# Patient Record
Sex: Male | Born: 1945 | Race: Black or African American | Hispanic: No | Marital: Married | Smoking: Never smoker
Health system: Southern US, Community
[De-identification: ages and names within clinical notes are randomized; demographics above are authoritative.]

## PROBLEM LIST (undated history)

## (undated) DIAGNOSIS — L0291 Cutaneous abscess, unspecified: Secondary | ICD-10-CM

## (undated) DIAGNOSIS — K5792 Diverticulitis of intestine, part unspecified, without perforation or abscess without bleeding: Secondary | ICD-10-CM

## (undated) DIAGNOSIS — J40 Bronchitis, not specified as acute or chronic: Secondary | ICD-10-CM

## (undated) DIAGNOSIS — M25569 Pain in unspecified knee: Secondary | ICD-10-CM

## (undated) HISTORY — PX: ABSCESS DRAINAGE: SHX1119

---

## 2007-04-23 ENCOUNTER — Emergency Department (HOSPITAL_COMMUNITY): Admission: EM | Admit: 2007-04-23 | Discharge: 2007-04-23 | Payer: Self-pay | Admitting: Emergency Medicine

## 2007-08-25 ENCOUNTER — Inpatient Hospital Stay (HOSPITAL_COMMUNITY): Admission: EM | Admit: 2007-08-25 | Discharge: 2007-08-28 | Payer: Self-pay | Admitting: General Surgery

## 2010-06-20 NOTE — Op Note (Signed)
NAME:  [name], Clarence Gonzalez               ACCOUNT NO.:  [id number]   MEDICAL RECORD NO.:  [id number]          PATIENT TYPE:  INP   LOCATION:  1344                         FACILITY:  [facility]   PHYSICIAN:  Clarence Gonzalez, M.D.DATE OF BIRTH:  1945-06-15   DATE OF PROCEDURE:  08/25/2007  DATE OF DISCHARGE:                               OPERATIVE REPORT   PREOPERATIVE DIAGNOSIS:  Complex perirectal abscess in the perineal body  area.   POSTOPERATIVE DIAGNOSIS:  Complex perirectal abscess extending posterior  to base of penis.   ANESTHESIA:  General anesthesia.   PROCEDURE:  Drainage of complex perirectal abscess.   HISTORY:  Clarence Gonzalez is a 65 year old male who presented to our  office today.  Clarence Gonzalez, M.D. has been treating him for an  infection.  The patient states that earlier in the Spring he had a  perirectal abscess in the left buttock area that was drained in the  office.  He improved and appeared to be doing fine.  I do not have the  details of  Dr. Meridee Gonzalez office notes, but he presented to the [facility] office today in [place] and had a large abscess on the opposite  side.  It was at the base of the scrotum, kind of midline and thought  that this was certainly too large to try any type of drainage in the  office.  He was sent on over to the hospital and started on intravenous  antibiotics of Zosyn and I saw him and was certainly in agreement.  The  abscess this time was on the right side with a depth big perirectal  cheek type abscess.  I cannot feel any actual internal fistula opening  either anterior or posterior and then at the base of the scrotum in the  midline he has a big lemon-size defect that I am sure communicates with  the perirectal component.  I do not appreciate __________ the left side  that you can see the little incision at approximately 3 o'clock area.  The little area was drained by Angelia Gonzalez. Clarence Gonzalez, M.D.  The patient  stated that years ago  I drained the perirectal abscess on him, but I do  not have details of this.   DESCRIPTION OF PROCEDURE:  The patient was taken to the operative suite  with informed consent.  His white count was not elevated and he was  induced under general anesthesia by __________ and placed in the [facility]  stirrups.  With the examination under anesthesia the findings were the  same; marked cellulitis and swelling on the right side and up top in the  left and the area going to the base of the scrotum sort of right at the  midline area.  I prepped him with Betadine solution and then used a  needle to aspirate into the largest component up to approximately  midline on the right side and probably a tablespoon of pus was aspirated  and this was sent for aerobic and anaerobic cultures.  I then made a  little incision in the area and the communication goes  to the posterior  midline and then anterior all along the right side and this loculation  was broken up.  I then made a little incision right over the biggest  swollen area at the base of the penis right at the midline and this was  frank purulence and all of the tissue I culture it for aerobic and  anaerobic and all of the necrotic tissue was sharply debrided.  Hemostasis did not appear to be a problem and I then could definitely  identify the communication a little deeper than right immediately under  the skin going to the base of the scrotum.  We then opened that up and  used irrigation with saline to break up the little pockets of frank  abscess.  I then put two quarter inch Penrose drains to communicate  across the area up to the base of the scrotum and tied the two ends  together so they would not fall out and then the wound was packed with  half inch Iodoform gauze with solution of Betadine applied to it and  then ABD and stretch panties were placed on the patient.  Hopefully he  will be able to void, but if there is any question we will put a Foley   catheter and we will await the results of the cultures.  On careful  anoscopic examination with a Hemostat, I cannot find obvious  communication in the anterior or posterior.  I do not know where the  abscesses are where I would expect the communication, but suspect that  this is something that has been brewing for several days, possibly a  week or so.  The patient states that the reason Dr. Georgina Gonzalez had him on  antibiotics and Flomax __________ from urinary __________ but the  patient says that he cannot tell much difference with the Flomax  __________.  The patient was awakened and taken to the recovery room in  stable postoperative condition.  We will start Sitz baths in the  morning.  I expect he is going to be in the hospital at least a couple  of days or so due to this complex perirectal abscess.           ______________________________  Clarence Pancoast. Clarence Gonzalez, M.D.     WJW/MEDQ  D:  08/25/2007  T:  08/26/2007  Job:  [id number]

## 2010-06-20 NOTE — H&P (Signed)
NAME:  Clarence Gonzalez, Clarence Gonzalez               ACCOUNT NO.:  [id number]   MEDICAL RECORD NO.:  [id number]          PATIENT TYPE:  EMS   LOCATION:  MAJO                         FACILITY:  [facility]   PHYSICIAN:  Juanetta Gosling, MDDATE OF BIRTH:  Apr 20, 1945   DATE OF ADMISSION:  04/23/2007  DATE OF DISCHARGE:  04/23/2007                              HISTORY & PHYSICAL   Mr. Clarence Gonzalez is a 65 year old male who has a prior history in March of  having a left gluteal abscess drained and packed by Dr. Derrell Lolling at [facility].  He returns today with a several-day history of a right gluteal abscess  that has now, over the last day or so, extended towards his scrotum and  involves his perineum.  He denies any fevers at home.  This area has  become significantly more tender in the last 24 hours.   PAST MEDICAL HISTORY:  Includes the gluteal abscess drainage, several  months ago a right groin abscess incised and drained.  History of  diverticulitis.  Hypertension and BPH.   CURRENT MEDICATIONS:  He is taking Flomax.  He does not know what  antihypertensive medicine that he takes.   He has no known drug allergies.   SOCIAL HISTORY:  He does not smoke or drink.   REVIEW OF SYSTEMS:  As documented in the [facility] chart.   EXAMINATION:  RECTAL:  A right gluteal abscess with induration and  tenderness in that area both anteriorly and posteriorly extending down  into his perineum towards the scrotum.  I did not complete a rectal exam  due to his tenderness.   ASSESSMENT:  Right gluteal abscess.   PLAN:  For admission to [facility].  IV antibiotics due to  some erythema around this area p.o.  Incision and drainage of this  abscess in the operating room.  He and I discussed this plan and he will  proceed to [facility].      Juanetta Gosling, MD  Electronically Signed     MCW/MEDQ  D:  08/25/2007  T:  08/25/2007  Job:  [id number]

## 2010-06-23 NOTE — Discharge Summary (Signed)
NAME:  Clarence Gonzalez, Clarence Gonzalez               ACCOUNT NO.:  [id number]   MEDICAL RECORD NO.:  [id number]          PATIENT TYPE:  INP   LOCATION:  1344                         [facility]ACILITY:  [facility]   PHYSICIAN:  Anselm Pancoast. Weatherly, M.D.DATE OF BIRTH:  Aug 31, 1945   DATE OF ADMISSION:  08/25/2007  DATE OF DISCHARGE:  08/28/2007                               DISCHARGE SUMMARY   HISTORY AND HOSPITAL COURSE:  Clarence Gonzalez is a 65 year old moderately  overweight male who was seen in the urgent office by Dr. Dwain Sarna on  August 25, 2007 with the following history.  He had a perirectal abscess  drained by Dr. Derrell Lolling in the office approximately three months earlier.  He stated he had swelling and pain and significant difficulty passing  his urine.  When he presented back he had recurrent perirectal abscess.  The area was swollen over the right side perirectal and also swelling  right in the perineal body in the midline.  Dr. Dwain Sarna felt that this  was much too large to drain as a procedure in the urgent office.  I was  called and asked that the patient be sent over and he did.  His white  count was 8,300, his hematocrit was 30.8. He was taken to the operating  room and under general anesthesia, I placed him on Zosyn for the  perirectal abscess and he was taken to the operating room and placed in  [place] stirrups.  At first a very careful rectal examination with  endoscope done and I could not find any internal opening.  I opened up  the large abscess to the right of the perirectal area, cultured it and  then noted that there was definitely a crack that extends up to the base  of the perineal area and opened that area.  Both of these areas required  marked debridement because of the infected fissure but it appears to  kind of track out laterally.  Postoperatively he said he felt much  better.  The results of the cultures showed  gram-positive organism and  it was three days before they identified that this  was MRSA.  I was  surprised in that this was not an obvious perirectal abscess that is  usually not MRSA and I do not think the abscess had been cultured  previously in the office.  We switched him to Septra DS.  At the time of  his discharge he still had the little Penrose drains connected to two  areas that communicated and most likely this is not a true perirectal  abscess but complex abscess of the buttocks that is MRSA.  His white  count postoperatively was also 7,200 and the wound is cleaning up  nicely.  He started soaking in Sitz baths the day after surgery and was  discharged on antibiotics.  He had a Foley catheter for approximately  two days since he had a problem with BPH but he was able to void  satisfactorily.  I will see him in the office in followup.  He will  continue to soak the area and hopefully this will  not be a recurrent  problem.  He is to continue with all of his usual medications with the  addition of the Septra DS.           ______________________________  Anselm Pancoast. Zachery Dakins, M.D.     WJW/MEDQ  D:  09/22/2007  T:  09/22/2007  Job:  [id number]   cc:   Oley Balm. Georgina Pillion, M.D.  Fax: [phone]

## 2010-10-30 LAB — URINALYSIS, ROUTINE W REFLEX MICROSCOPIC
Glucose, UA: 100 — AB
Ketones, ur: 15 — AB
pH: 6

## 2010-10-30 LAB — URINE MICROSCOPIC-ADD ON

## 2010-10-30 LAB — URINE CULTURE

## 2010-11-03 LAB — CBC
MCHC: 32.7
Platelets: 274
Platelets: 288
RBC: 3.24 — ABNORMAL LOW
RBC: 3.68 — ABNORMAL LOW
WBC: 7.2

## 2010-11-03 LAB — ANAEROBIC CULTURE

## 2010-11-03 LAB — CULTURE, ROUTINE-ABSCESS: Gram Stain: NONE SEEN

## 2010-11-03 LAB — BASIC METABOLIC PANEL
BUN: 20
CO2: 30
Calcium: 9.3
Creatinine, Ser: 1.38
GFR calc Af Amer: >60

## 2014-09-22 ENCOUNTER — Encounter (HOSPITAL_COMMUNITY): Payer: Self-pay

## 2014-09-22 ENCOUNTER — Emergency Department (HOSPITAL_COMMUNITY)
Admission: EM | Admit: 2014-09-22 | Discharge: 2014-09-22 | Disposition: A | Discharge status: Home / Self Care | Payer: BLUE CROSS/BLUE SHIELD | Attending: Emergency Medicine | Admitting: Emergency Medicine

## 2014-09-22 ENCOUNTER — Emergency Department (HOSPITAL_COMMUNITY): Payer: BLUE CROSS/BLUE SHIELD

## 2014-09-22 DIAGNOSIS — K5732 Diverticulitis of large intestine without perforation or abscess without bleeding: Secondary | ICD-10-CM | POA: Diagnosis not present

## 2014-09-22 DIAGNOSIS — Z79899 Other long term (current) drug therapy: Secondary | ICD-10-CM | POA: Diagnosis not present

## 2014-09-22 DIAGNOSIS — Z872 Personal history of diseases of the skin and subcutaneous tissue: Secondary | ICD-10-CM | POA: Insufficient documentation

## 2014-09-22 DIAGNOSIS — Z8709 Personal history of other diseases of the respiratory system: Secondary | ICD-10-CM | POA: Insufficient documentation

## 2014-09-22 DIAGNOSIS — R1032 Left lower quadrant pain: Secondary | ICD-10-CM | POA: Diagnosis present

## 2014-09-22 HISTORY — DX: Bronchitis, not specified as acute or chronic: J40

## 2014-09-22 HISTORY — DX: Cutaneous abscess, unspecified: L02.91

## 2014-09-22 HISTORY — DX: Diverticulitis of intestine, part unspecified, without perforation or abscess without bleeding: K57.92

## 2014-09-22 HISTORY — DX: Pain in unspecified knee: M25.569

## 2014-09-22 LAB — COMPREHENSIVE METABOLIC PANEL
ALBUMIN: 4.3 g/dL (ref 3.5–5.0)
ALK PHOS: 73 U/L (ref 38–126)
ALT: 19 U/L (ref 17–63)
AST: 19 U/L (ref 15–41)
Anion gap: 7 (ref 5–15)
BILIRUBIN TOTAL: 1 mg/dL (ref 0.3–1.2)
BUN: 18 mg/dL (ref 6–20)
CALCIUM: 9.2 mg/dL (ref 8.9–10.3)
CO2: 25 mmol/L (ref 22–32)
CREATININE: 1.15 mg/dL (ref 0.61–1.24)
Chloride: 106 mmol/L (ref 101–111)
GFR calc non Af Amer: >60 mL/min (ref >60)
GLUCOSE: 99 mg/dL (ref 65–99)
Potassium: 3.9 mmol/L (ref 3.5–5.1)
SODIUM: 138 mmol/L (ref 135–145)
Total Protein: 7.4 g/dL (ref 6.5–8.1)

## 2014-09-22 LAB — LIPASE, BLOOD: Lipase: 24 U/L (ref 22–51)

## 2014-09-22 LAB — CBC WITH DIFFERENTIAL/PLATELET
Basophils Absolute: 0 10*3/uL (ref 0.0–0.1)
Basophils Relative: 0 % (ref 0–1)
EOS ABS: 0.1 10*3/uL (ref 0.0–0.7)
EOS PCT: 2 % (ref 0–5)
HCT: 35.7 % — ABNORMAL LOW (ref 39.0–52.0)
Hemoglobin: 11.5 g/dL — ABNORMAL LOW (ref 13.0–17.0)
LYMPHS ABS: 1 10*3/uL (ref 0.7–4.0)
Lymphocytes Relative: 11 % — ABNORMAL LOW (ref 12–46)
MCH: 27.4 pg (ref 26.0–34.0)
MCHC: 32.2 g/dL (ref 30.0–36.0)
MCV: 85.2 fL (ref 78.0–100.0)
MONOS PCT: 8 % (ref 3–12)
Monocytes Absolute: 0.7 10*3/uL (ref 0.1–1.0)
Neutro Abs: 7.1 10*3/uL (ref 1.7–7.7)
Neutrophils Relative %: 79 % — ABNORMAL HIGH (ref 43–77)
PLATELETS: 219 10*3/uL (ref 150–400)
RBC: 4.19 MIL/uL — ABNORMAL LOW (ref 4.22–5.81)
RDW: 12.7 % (ref 11.5–15.5)
WBC: 8.9 10*3/uL (ref 4.0–10.5)

## 2014-09-22 LAB — URINALYSIS, ROUTINE W REFLEX MICROSCOPIC
Bilirubin Urine: NEGATIVE
GLUCOSE, UA: NEGATIVE mg/dL
HGB URINE DIPSTICK: NEGATIVE
KETONES UR: NEGATIVE mg/dL
Leukocytes, UA: NEGATIVE
Nitrite: NEGATIVE
PROTEIN: NEGATIVE mg/dL
Specific Gravity, Urine: 1.023 (ref 1.005–1.030)
Urobilinogen, UA: 0.2 mg/dL (ref 0.0–1.0)
pH: 6 (ref 5.0–8.0)

## 2014-09-22 MED ORDER — METRONIDAZOLE 500 MG PO TABS: 500.0000 mg | ORAL_TABLET | Freq: Two times a day (BID) | ORAL | Status: AC

## 2014-09-22 MED ORDER — SODIUM CHLORIDE 0.9 % IV BOLUS (SEPSIS)
1000.0000 mL | Freq: Once | INTRAVENOUS | Status: AC
  Administered 2014-09-22: 1000 mL via INTRAVENOUS

## 2014-09-22 MED ORDER — MORPHINE SULFATE (PF) 4 MG/ML IV SOLN
4.0000 mg | Freq: Once | INTRAVENOUS | Status: AC
Start: 2014-09-22 — End: 2014-09-22
  Administered 2014-09-22: 4 mg via INTRAVENOUS
  Filled 2014-09-22: qty 1

## 2014-09-22 MED ORDER — DOCUSATE SODIUM 100 MG PO CAPS: 100.0000 mg | ORAL_CAPSULE | Freq: Two times a day (BID) | ORAL | Status: AC

## 2014-09-22 MED ORDER — IOHEXOL 300 MG/ML  SOLN
50.0000 mL | Freq: Once | INTRAMUSCULAR | Status: AC | PRN
  Administered 2014-09-22: 50 mL via ORAL

## 2014-09-22 MED ORDER — IOHEXOL 300 MG/ML  SOLN
100.0000 mL | Freq: Once | INTRAMUSCULAR | Status: AC | PRN
  Administered 2014-09-22: 100 mL via INTRAVENOUS

## 2014-09-22 MED ORDER — CIPROFLOXACIN HCL 500 MG PO TABS: 500.0000 mg | ORAL_TABLET | Freq: Two times a day (BID) | ORAL | Status: AC

## 2014-09-22 MED ORDER — HYDROCODONE-ACETAMINOPHEN 5-325 MG PO TABS: 1.0000 | ORAL_TABLET | ORAL | Status: AC | PRN

## 2014-09-22 NOTE — ED Provider Notes (Signed)
CSN: [id number]     Arrival date & time 09/22/14  0840 History   First MD Initiated Contact with Patient 09/22/14 0914     Chief Complaint  Patient presents with  . Abdominal Pain  . Constipation     (Consider location/radiation/quality/duration/timing/severity/associated sxs/prior Treatment) HPI Patient states he has left lower quadrant pain that feels like a prior episode of diverticulitis. It started fairly acutely. He awakened with the pain this morning. He describes it as severe and aching in his left lower quadrant. It's worse with pressure. He denies having seen blood in his stool. He does report problems with constipation. He denies vomiting. Denies urinary symptoms of pain burning or urgency. Denies testicular pain or flank pain. Past Medical History  Diagnosis Date  . Diverticulitis   . Bronchitis   . Knee pain   . Abscess    Past Surgical History  Procedure Laterality Date  . Abscess drainage     No family history on file. Social History  Substance Use Topics  . Smoking status: Never Smoker   . Smokeless tobacco: None  . Alcohol Use: Yes     Comment: former    Review of Systems 10 Systems reviewed and are negative for acute change except as noted in the HPI.    Allergies  Echinacea-zn  Home Medications   Prior to Admission medications   Medication Sig Start Date End Date Taking? Authorizing Provider  atorvastatin (LIPITOR) 20 MG tablet Take 1 tablet by mouth daily. 09/09/14  Yes Historical Provider, MD  doxazosin (CARDURA) 2 MG tablet Take 1 tablet by mouth daily. 09/09/14  Yes Historical Provider, MD  naproxen sodium (ANAPROX) 220 MG tablet Take 220 mg by mouth 2 (two) times daily as needed (pain).   Yes Historical Provider, MD  ciprofloxacin (CIPRO) 500 MG tablet Take 1 tablet (500 mg total) by mouth 2 (two) times daily. 09/22/14   Arby Barrette, MD  docusate sodium (COLACE) 100 MG capsule Take 1 capsule (100 mg total) by mouth every 12 (twelve) hours.  09/22/14   Arby Barrette, MD  HYDROcodone-acetaminophen (NORCO/VICODIN) 5-325 MG per tablet Take 1-2 tablets by mouth every 4 (four) hours as needed for moderate pain or severe pain. 09/22/14   Arby Barrette, MD  metroNIDAZOLE (FLAGYL) 500 MG tablet Take 1 tablet (500 mg total) by mouth 2 (two) times daily. One po bid x 7 days 09/22/14   Arby Barrette, MD   BP 137/68 mmHg  Pulse 70  Temp(Src) 98.1 F (36.7 C) (Oral)  Resp 18  Ht 6' (1.829 m)  Wt 240 lb (108.863 kg)  BMI 32.54 kg/m2  SpO2 99% Physical Exam  Constitutional: He is oriented to person, place, and time. He appears well-developed and well-nourished.  HENT:  Head: Normocephalic and atraumatic.  Eyes: EOM are normal. Pupils are equal, round, and reactive to light.  Neck: Neck supple.  Cardiovascular: Normal rate, regular rhythm, normal heart sounds and intact distal pulses.   Pulmonary/Chest: Effort normal and breath sounds normal.  Abdominal: Soft. Bowel sounds are normal. He exhibits no distension. There is tenderness.  Left lower quadrant tenderness palpation with voluntary guarding. Femoral pulses 2+ and symmetric. No inguinal mass or fullness.  Genitourinary:  Bilateral testicles smooth and nontender. No scrotal edema.  Musculoskeletal: Normal range of motion. He exhibits no edema.  Neurological: He is alert and oriented to person, place, and time. He has normal strength. Coordination normal. GCS eye subscore is 4. GCS verbal subscore is 5. GCS motor subscore  is 6.  Skin: Skin is warm, dry and intact.  Psychiatric: He has a normal mood and affect.    ED Course  Procedures (including critical care time) Labs Review Labs Reviewed  CBC WITH DIFFERENTIAL/PLATELET - Abnormal; Notable for the following:    RBC 4.19 (*)    Hemoglobin 11.5 (*)    HCT 35.7 (*)    Neutrophils Relative % 79 (*)    Lymphocytes Relative 11 (*)    All other components within normal limits  COMPREHENSIVE METABOLIC PANEL  LIPASE, BLOOD   URINALYSIS, ROUTINE W REFLEX MICROSCOPIC (NOT AT [facility])    Imaging Review Ct Abdomen Pelvis W Contrast  09/22/2014   CLINICAL DATA:  Sudden onset of left lower quadrant abdominal pain beginning this morning.  EXAM: CT ABDOMEN AND PELVIS WITH CONTRAST  TECHNIQUE: Multidetector CT imaging of the abdomen and pelvis was performed using the standard protocol following bolus administration of intravenous contrast.  CONTRAST:  50mL OMNIPAQUE IOHEXOL 300 MG/ML SOLN, OMNIPAQUE IOHEXOL 300 MG/ML SOLN  COMPARISON:  The one-view abdomen 12/20/2009  FINDINGS: Mild dependent atelectasis is present at the lung bases bilaterally. Lungs are otherwise clear. Heart size is normal. No significant pleural or pericardial effusion is present. Gynecomastia is noted bilaterally.  Stomach, duodenum, and pancreas are within normal limits. The liver demonstrates mild fatty infiltration. A 6 mm well-circumscribed lesion in the right lobe likely represents a a small cyst. The common bile duct and gallbladder are within normal limits for age. The adrenal glands are normal bilaterally. Benign appearing cysts are present in the right kidney. The largest measures 1.8 cm. A 1 point cm cyst is present in the left kidney is well. Nonobstructing stones are present at the lower pole wall of both kidneys. The largest is at the lower pole of the left kidney measuring at 12 mm. There is no obstruction. The ureters are within normal limits.  Diverticular changes are present throughout the sigmoid colon. There is focal inflammation at the junction of the descending and sigmoid colon. No free air or abscess formation is present. The more proximal colon is within normal limits. The appendix is visualized and normal. The small bowel is unremarkable.  Extensive atherosclerotic calcifications are present in the aorta and branch vessels. No significant adenopathy or free fluid is present. The prostate gland is somewhat prominent, extending superiorly  into the base the bladder. Transverse diameter is 5.7 cm. A vacuum disc is present at L4-5 and L5-S1. Grade 1 anterolisthesis is present at L2 and L3-4. Vertebral body heights and alignment are maintained. No focal lytic or blastic lesions are present. Mild degenerative changes are noted at the SI joints bilaterally.  IMPRESSION: 1. Inflammatory changes with diverticulitis at the junction of the descending and sigmoid colon. 2. More proximal colon and appendix are visualized and normal. 3. Nonobstructing nephrolithiasis at the lower poles of both kidneys. 4. Bilateral renal cysts. 5. 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 6. Enlargement of the prostate gland.   Electronically Signed   By: Marin Roberts M.D.   On: 09/22/2014 12:24   I have personally reviewed and evaluated these images and lab results as part of my medical decision-making.   EKG Interpretation None      MDM   Final diagnoses:  Diverticulitis of large intestine without perforation or abscess without bleeding    Patient had distant history of diverticulitis. Findings today are consistent with a recurrence. He has not had fever, vomiting. At this time CT does  not show any abscess or perforation. Patient is a good candidate for outpatient treatment. He is counseled on the signs and symptoms for which return. He is counseled on follow-up with his family physician for reassessment next week as well as the need for follow-up colonoscopy.    Arby Barrette, MD 09/22/14 1332

## 2014-09-22 NOTE — ED Notes (Signed)
Pt states sudden onset with LLQ pain started this am. Denies N/V/D and fever.

## 2014-09-22 NOTE — ED Notes (Signed)
MD at bedside. 

## 2014-09-22 NOTE — ED Notes (Signed)
Bed: RU04 Expected date: 09/22/14 Expected time: 9:08 AM Means of arrival: Ambulance Comments: Ems-nosebleed

## 2014-09-22 NOTE — ED Notes (Signed)
Patient transported to CT 

## 2014-09-22 NOTE — Discharge Instructions (Signed)

## 2014-09-22 NOTE — ED Notes (Signed)
Pt aware not to drive.

## 2016-05-26 IMAGING — CT CT ABD-PELV W/ CM
2 of 5 series · 16 of 46 positions shown, 18 images · IV contrast (OMNIPAQUE 300)
Comparison: The one-view abdomen 12/20/2009

CLINICAL DATA: Sudden onset of left lower quadrant abdominal pain
beginning this morning.

EXAM:
CT ABDOMEN AND PELVIS WITH CONTRAST
TECHNIQUE: Multidetector CT imaging of the abdomen and pelvis was performed
using the standard protocol following bolus administration of
intravenous contrast.
CONTRAST:  50mL OMNIPAQUE IOHEXOL 300 MG/ML SOLN, 100mL OMNIPAQUE
IOHEXOL 300 MG/ML SOLN

[Series 2: abd/pel with · axial · 0.83mm/px · z∈[-468,-38]mm · 13 of 98 slices shown, 15 images]
[im 6/98  soft-tissue]
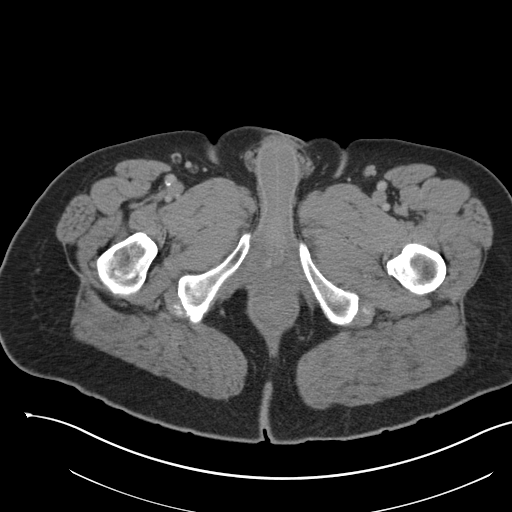
[im 6/98  bone]
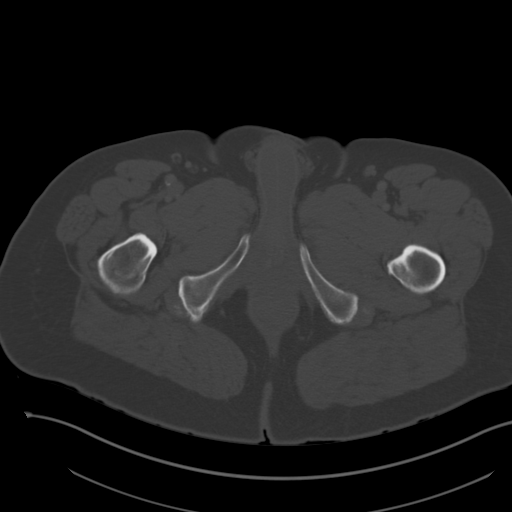
[im 11/98  soft-tissue]
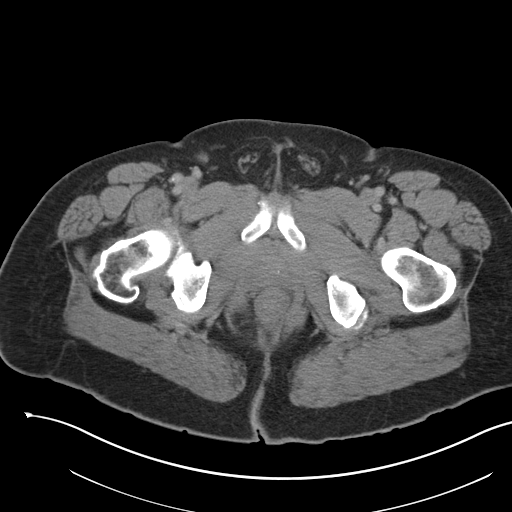
[im 22/98  soft-tissue]
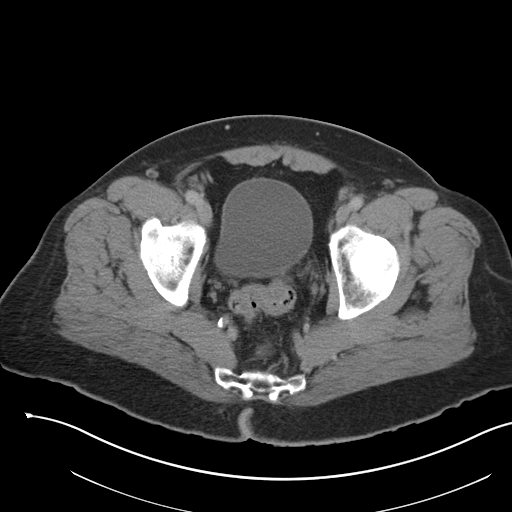
[im 27/98  soft-tissue]
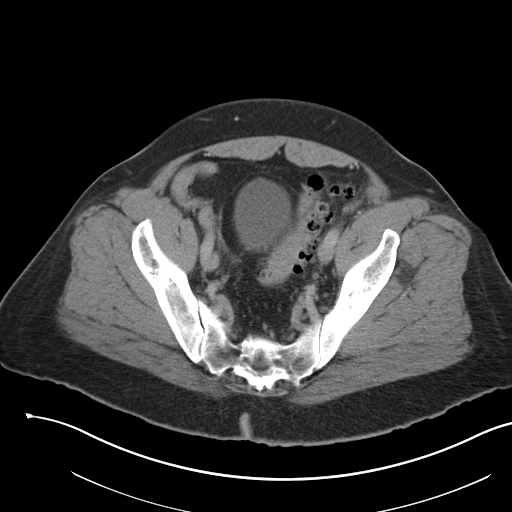
[im 33/98  soft-tissue]
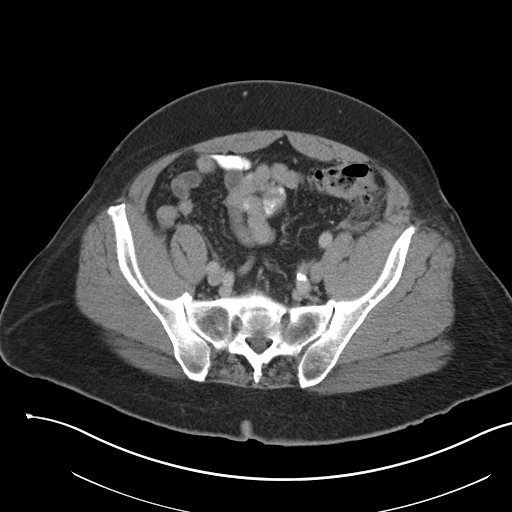
[im 44/98  soft-tissue]
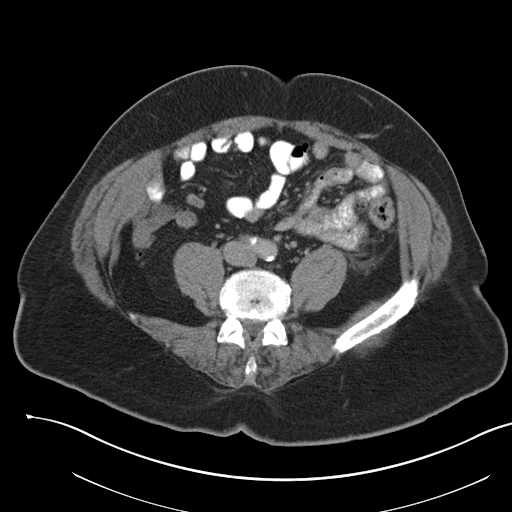
[im 49/98  soft-tissue]
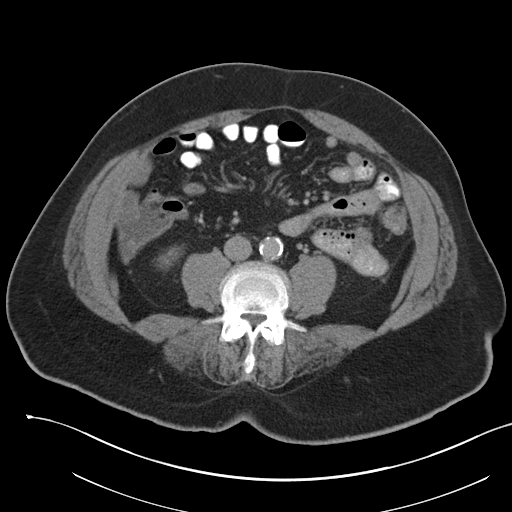
[im 54/98  soft-tissue]
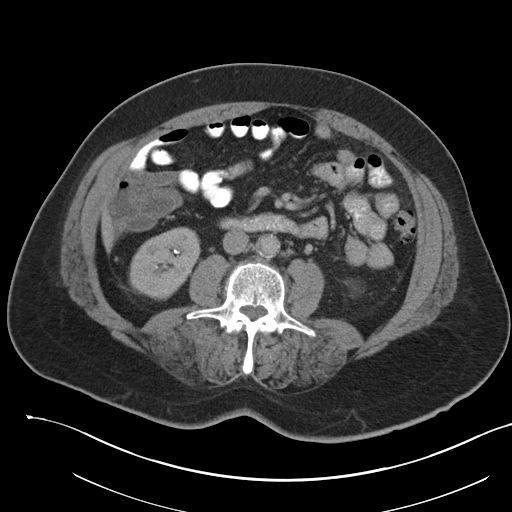
[im 65/98  soft-tissue]
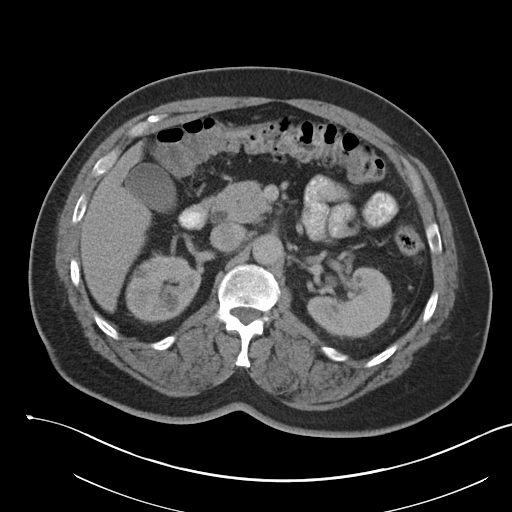
[im 65/98  bone]
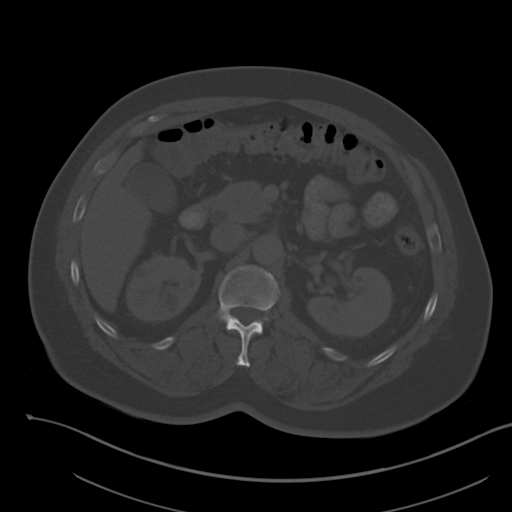
[im 71/98  soft-tissue]
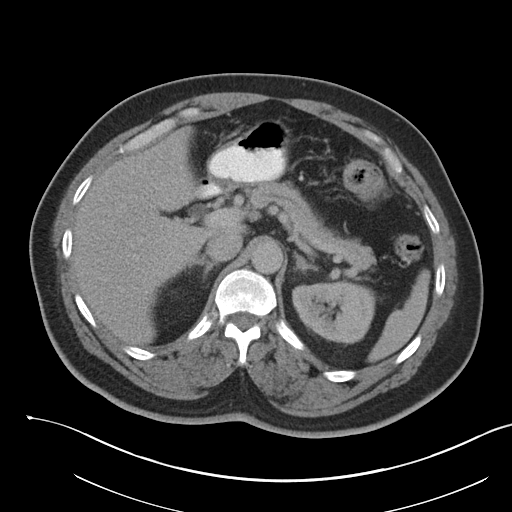
[im 76/98  soft-tissue]
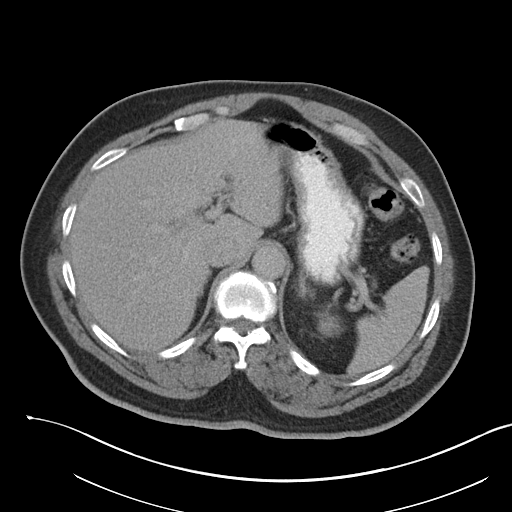
[im 87/98  soft-tissue]
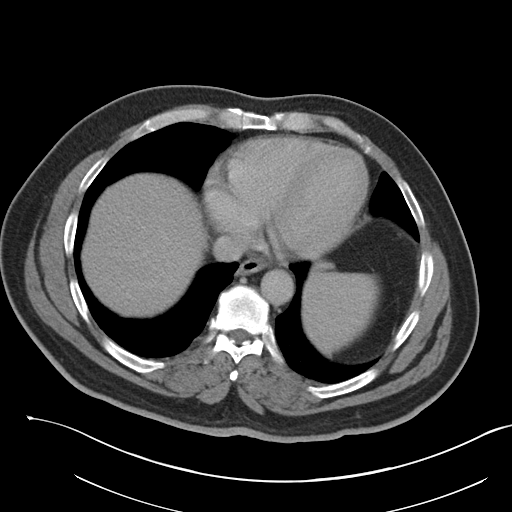
[im 92/98  soft-tissue]
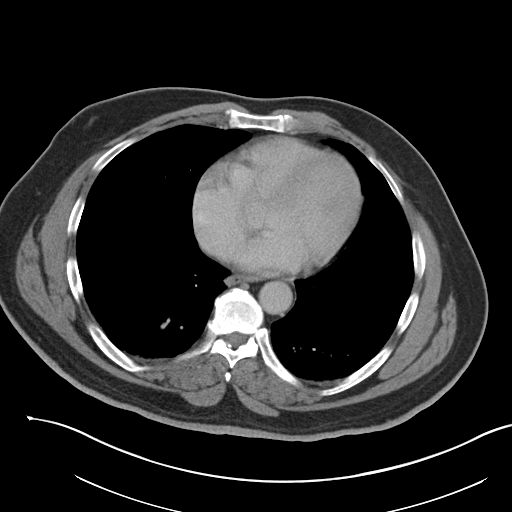

[Series 4: coronal a/|p · coronal · 0.85mm/px · 3 of 127 slices shown]
[im 43/127  soft-tissue]
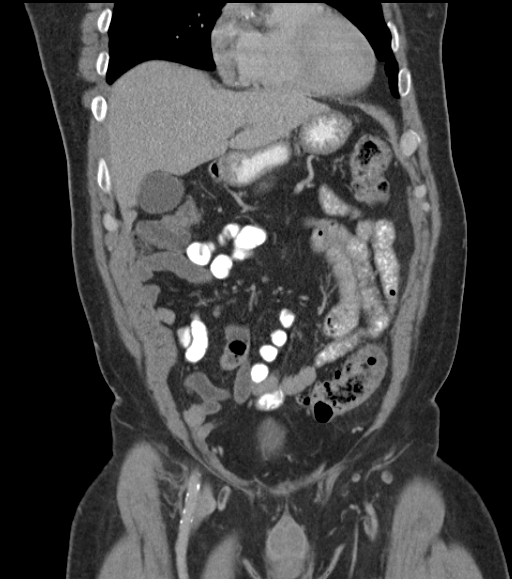
[im 57/127  soft-tissue]
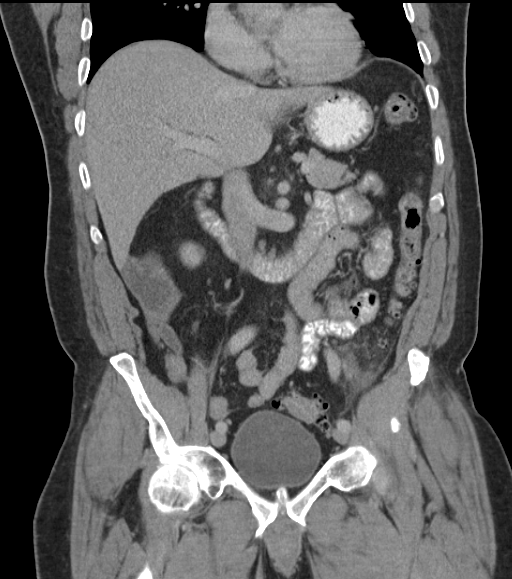
[im 71/127  soft-tissue]
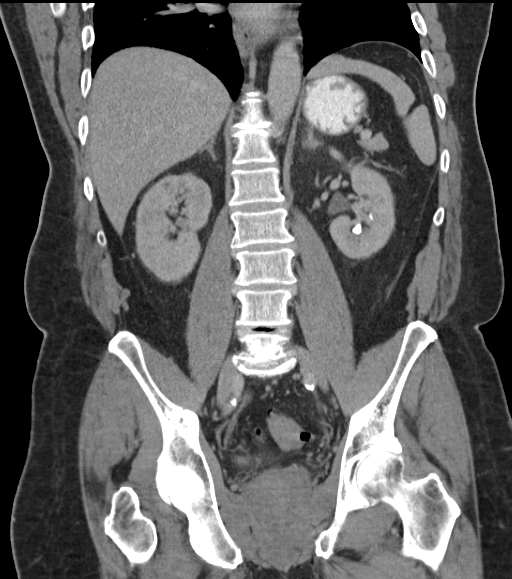

[16 of 46 positions shown; findings below may reference images not displayed]

FINDINGS: Mild dependent atelectasis is present at the lung bases bilaterally.
Lungs are otherwise clear. Heart size is normal. No significant
pleural or pericardial effusion is present. Gynecomastia is noted
bilaterally.

Stomach, duodenum, and pancreas are within normal limits. The liver
demonstrates mild fatty infiltration. A 6 mm well-circumscribed
lesion in the right lobe likely represents a a small cyst. The
common bile duct and gallbladder are within normal limits for age.
The adrenal glands are normal bilaterally. Benign appearing cysts
are present in the right kidney. The largest measures 1.8 cm. A 1
point cm cyst is present in the left kidney is well. Nonobstructing
stones are present at the lower pole wall of both kidneys. The
largest is at the lower pole of the left kidney measuring at 12 mm.
There is no obstruction. The ureters are within normal limits.

Diverticular changes are present throughout the sigmoid colon. There
is focal inflammation at the junction of the descending and sigmoid
colon. No free air or abscess formation is present. The more
proximal colon is within normal limits. The appendix is visualized
and normal. The small bowel is unremarkable.

Extensive atherosclerotic calcifications are present in the aorta
and branch vessels. No significant adenopathy or free fluid is
present. The prostate gland is somewhat prominent, extending
superiorly into the base the bladder. Transverse diameter is 5.7 cm.
A vacuum disc is present at L4-5 and L5-S1. Grade 1 anterolisthesis
is present at L2 and L3-4. Vertebral body heights and alignment are
maintained. No focal lytic or blastic lesions are present. Mild
degenerative changes are noted at the SI joints bilaterally.
IMPRESSION: 1. Inflammatory changes with diverticulitis at the junction of the
descending and sigmoid colon.
2. More proximal colon and appendix are visualized and normal.
3. Nonobstructing nephrolithiasis at the lower poles of both
kidneys.
4. Bilateral renal cysts.
5. Moderate spondylosis in the lower lumbar spine.
6. Enlargement of the prostate gland.

## 2017-04-15 DIAGNOSIS — J0100 Acute maxillary sinusitis, unspecified: Secondary | ICD-10-CM | POA: Diagnosis not present

## 2017-10-22 ENCOUNTER — Ambulatory Visit
Admission: RE | Admit: 2017-10-22 | Discharge: 2017-10-22 | Disposition: A | Discharge status: Home / Self Care | Payer: Self-pay | Source: Ambulatory Visit | Attending: Family Medicine | Admitting: Family Medicine

## 2017-10-22 ENCOUNTER — Other Ambulatory Visit: Payer: Self-pay | Admitting: Family Medicine

## 2017-10-22 DIAGNOSIS — R05 Cough: Secondary | ICD-10-CM | POA: Diagnosis not present

## 2017-10-22 DIAGNOSIS — L918 Other hypertrophic disorders of the skin: Secondary | ICD-10-CM | POA: Diagnosis not present

## 2017-10-22 DIAGNOSIS — Z23 Encounter for immunization: Secondary | ICD-10-CM | POA: Diagnosis not present

## 2017-10-22 DIAGNOSIS — R059 Cough, unspecified: Secondary | ICD-10-CM

## 2017-11-07 DIAGNOSIS — L918 Other hypertrophic disorders of the skin: Secondary | ICD-10-CM | POA: Diagnosis not present

## 2017-11-07 DIAGNOSIS — R05 Cough: Secondary | ICD-10-CM | POA: Diagnosis not present

## 2019-04-03 ENCOUNTER — Ambulatory Visit: Payer: Medicare Other | Attending: Internal Medicine

## 2019-04-03 DIAGNOSIS — Z23 Encounter for immunization: Secondary | ICD-10-CM | POA: Insufficient documentation

## 2019-04-03 NOTE — Progress Notes (Signed)
   Covid-19 Vaccination Clinic  Name:  Jamall [name]    MRN: [id number] DOB: [birth year]  04/03/2019  [name] was observed post Covid-19 immunization for 15 minutes without incidence. He was provided with Vaccine Information Sheet and instruction to access the V-Safe system.   [name] was instructed to call 911 with any severe reactions post vaccine: Marland Kitchen Difficulty breathing  . Swelling of your face and throat  . A fast heartbeat  . A bad rash all over your body  . Dizziness and weakness    Immunizations Administered    Name Date Dose VIS Date Route   Pfizer COVID-19 Vaccine 04/03/2019 11:08 AM 0.3 mL 01/16/2019 Intramuscular   Manufacturer: [employer], [employer]   Lot: [id number]   NDC: [address]-1

## 2019-04-28 ENCOUNTER — Ambulatory Visit: Payer: Medicare Other | Attending: Internal Medicine

## 2019-04-28 DIAGNOSIS — Z23 Encounter for immunization: Secondary | ICD-10-CM

## 2019-04-28 NOTE — Progress Notes (Signed)
   Covid-19 Vaccination Clinic  Name:  Birl [name]    MRN: [id number] DOB: [birth year]  04/28/2019  [name] was observed post Covid-19 immunization for 15 minutes without incident. He was provided with Vaccine Information Sheet and instruction to access the V-Safe system.   [name] was instructed to call 911 with any severe reactions post vaccine: Marland Kitchen Difficulty breathing  . Swelling of face and throat  . A fast heartbeat  . A bad rash all over body  . Dizziness and weakness   Immunizations Administered    Name Date Dose VIS Date Route   Pfizer COVID-19 Vaccine 04/28/2019  1:57 PM 0.3 mL 01/16/2019 Intramuscular   Manufacturer: [employer], [employer]   Lot: [id number]   NDC: [address]-3

## 2019-06-26 IMAGING — CR DG CHEST 2V
2 series · 2 of 2 positions shown · non-contrast
Comparison: None.

CLINICAL DATA: Chronic cough, productive

EXAM:
CHEST - 2 VIEW

[w chest pa]
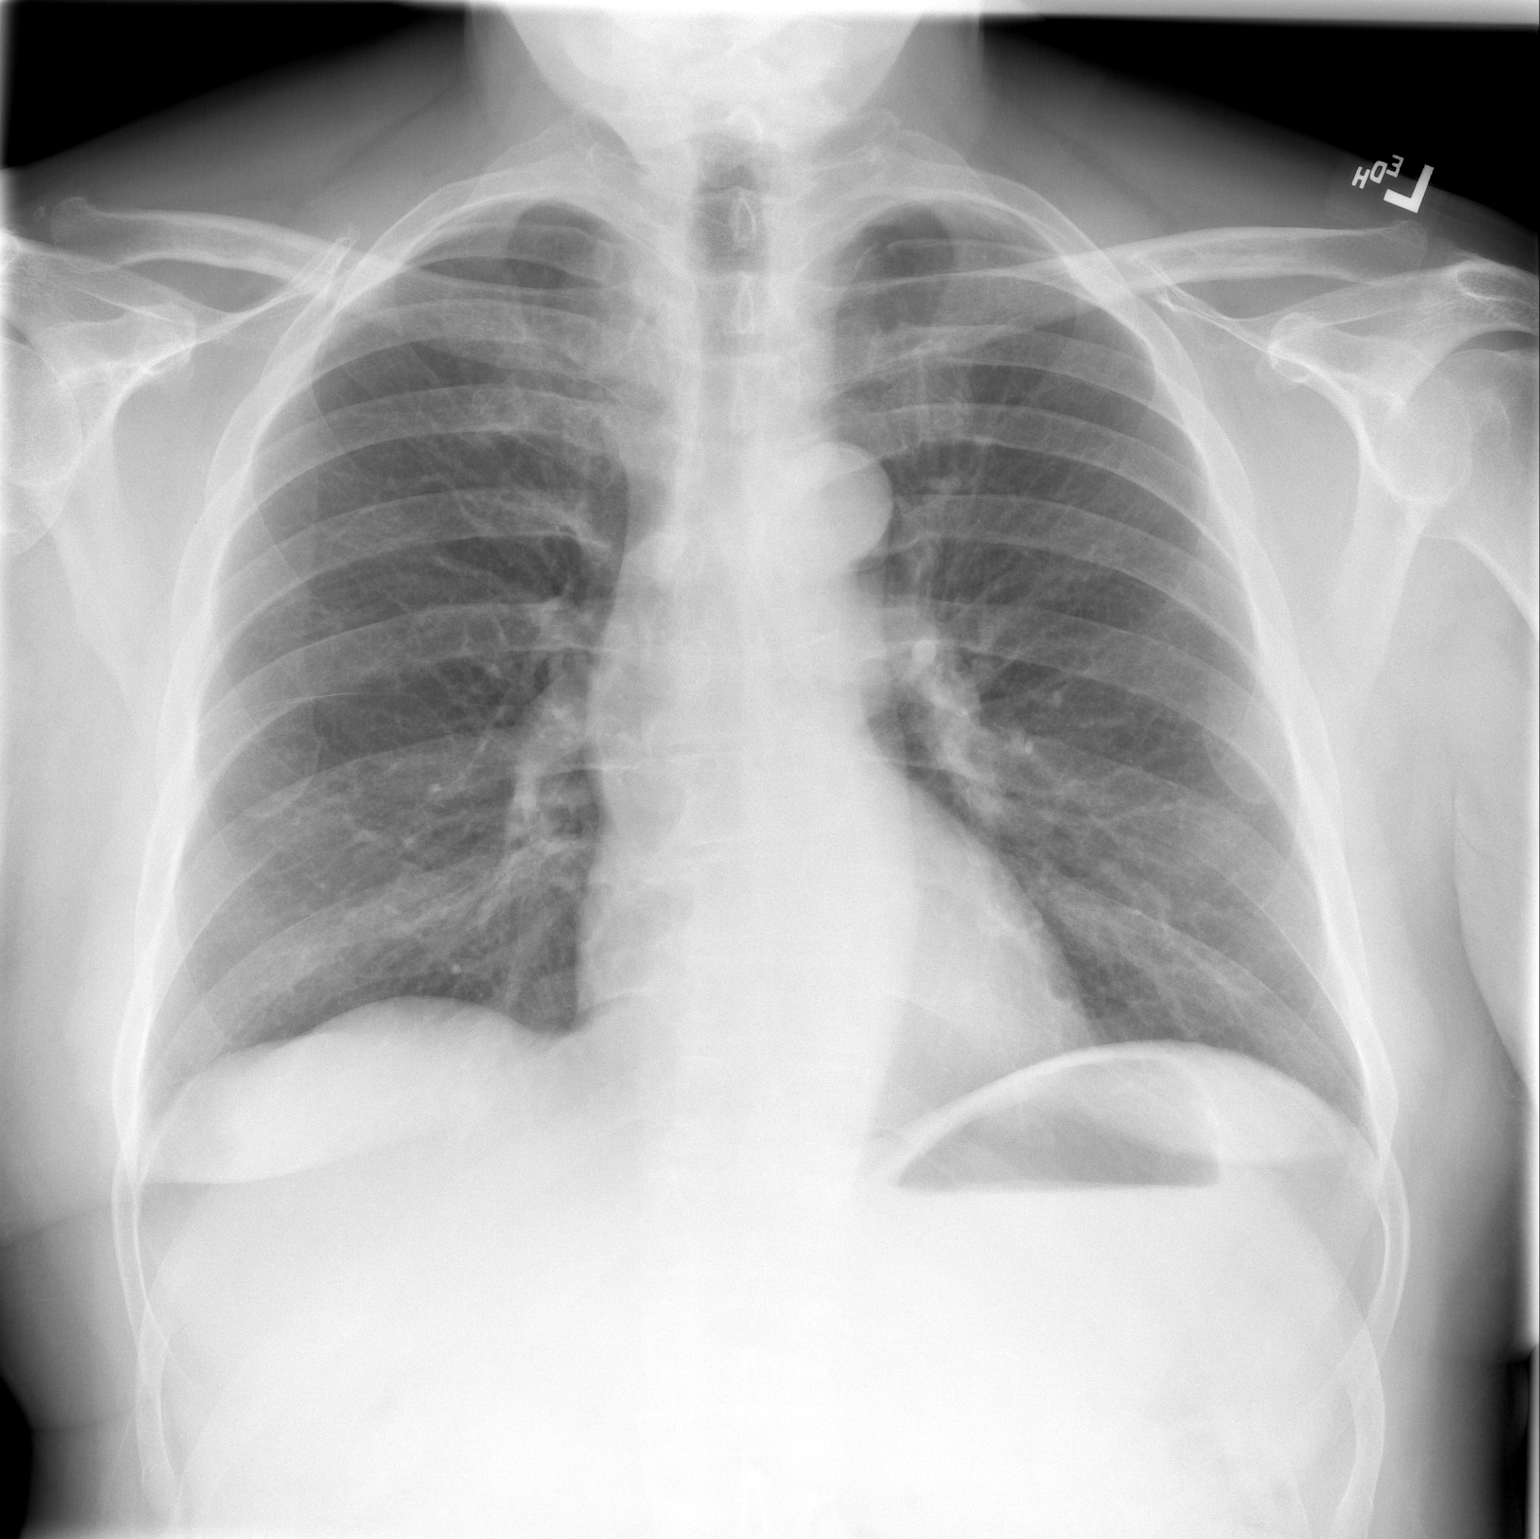

[w chest lat]
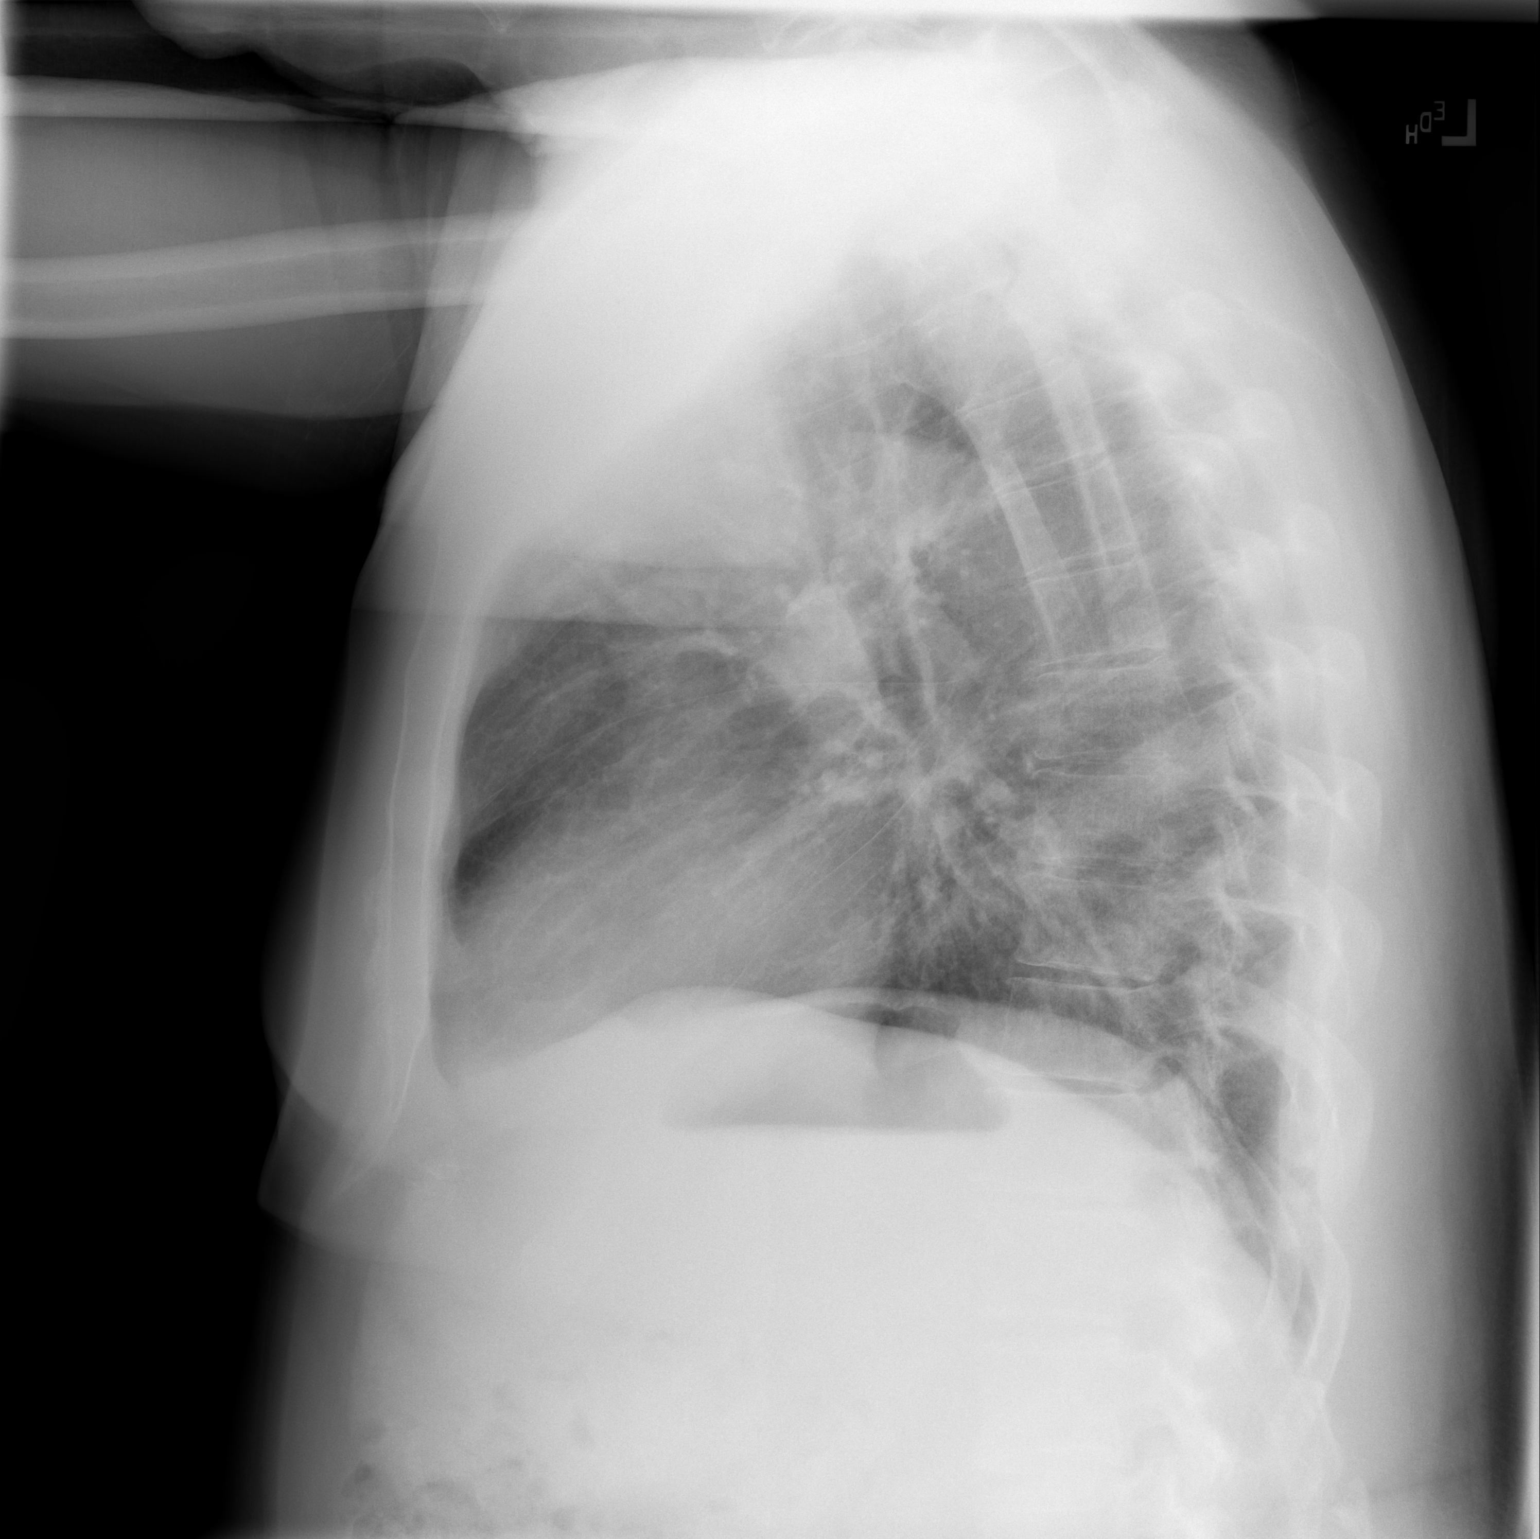

[2 of 2 positions shown; findings below may reference images not displayed]

FINDINGS: Normal heart size. Normal mediastinal contour. No pneumothorax. No
pleural effusion. Lungs appear clear, with no acute consolidative
airspace disease and no pulmonary edema.
IMPRESSION: No active cardiopulmonary disease.

## 2020-02-25 DIAGNOSIS — M1711 Unilateral primary osteoarthritis, right knee: Secondary | ICD-10-CM | POA: Diagnosis not present

## 2020-02-25 DIAGNOSIS — E782 Mixed hyperlipidemia: Secondary | ICD-10-CM | POA: Diagnosis not present

## 2020-02-25 DIAGNOSIS — Z79899 Other long term (current) drug therapy: Secondary | ICD-10-CM | POA: Diagnosis not present

## 2020-02-25 DIAGNOSIS — Z1211 Encounter for screening for malignant neoplasm of colon: Secondary | ICD-10-CM | POA: Diagnosis not present

## 2020-02-25 DIAGNOSIS — Z0001 Encounter for general adult medical examination with abnormal findings: Secondary | ICD-10-CM | POA: Diagnosis not present

## 2020-02-25 DIAGNOSIS — D649 Anemia, unspecified: Secondary | ICD-10-CM | POA: Diagnosis not present

## 2020-02-25 DIAGNOSIS — I10 Essential (primary) hypertension: Secondary | ICD-10-CM | POA: Diagnosis not present

## 2020-02-25 DIAGNOSIS — E7800 Pure hypercholesterolemia, unspecified: Secondary | ICD-10-CM | POA: Diagnosis not present

## 2020-03-17 DIAGNOSIS — Z1211 Encounter for screening for malignant neoplasm of colon: Secondary | ICD-10-CM | POA: Diagnosis not present

## 2020-04-01 ENCOUNTER — Emergency Department (HOSPITAL_COMMUNITY)
Admission: EM | Admit: 2020-04-01 | Discharge: 2020-04-01 | Disposition: A | Discharge status: Home / Self Care | Payer: Medicare Other | Attending: Emergency Medicine | Admitting: Emergency Medicine

## 2020-04-01 ENCOUNTER — Encounter (HOSPITAL_COMMUNITY): Payer: Self-pay

## 2020-04-01 ENCOUNTER — Other Ambulatory Visit: Payer: Self-pay

## 2020-04-01 DIAGNOSIS — H66001 Acute suppurative otitis media without spontaneous rupture of ear drum, right ear: Secondary | ICD-10-CM | POA: Diagnosis not present

## 2020-04-01 DIAGNOSIS — R6884 Jaw pain: Secondary | ICD-10-CM | POA: Diagnosis present

## 2020-04-01 DIAGNOSIS — R590 Localized enlarged lymph nodes: Secondary | ICD-10-CM | POA: Diagnosis not present

## 2020-04-01 MED ORDER — AMOXICILLIN 500 MG PO TABS: 1000.0000 mg | ORAL_TABLET | Freq: Two times a day (BID) | ORAL | 0 refills | Status: DC

## 2020-04-01 MED ORDER — AMOXICILLIN 500 MG PO CAPS
1000.0000 mg | ORAL_CAPSULE | Freq: Once | ORAL | Status: AC
  Administered 2020-04-01: 1000 mg via ORAL
  Filled 2020-04-01: qty 2

## 2020-04-01 MED ORDER — AMOXICILLIN 500 MG PO TABS: 1000.0000 mg | ORAL_TABLET | Freq: Two times a day (BID) | ORAL | 0 refills | Status: AC

## 2020-04-01 NOTE — ED Provider Notes (Signed)
[facility]-EMERGENCY DEPT Provider Note   CSN: [id number] Arrival date & time: 04/01/20  1535     History Chief Complaint  Patient presents with  . Abscess    Clarence Gonzalez is a 75 y.o. male.  75 yo M with a chief complaints of pain under his right jaw.  He noticed a lump there about an hour ago and had not noticed it before and so he felt like it must have occurred acutely and he was worried about getting worse and so came to the ED.  No difficulty breathing or swallowing.  No trauma to the area.  No fever.  Denies pain to the mouth or the ear or the scalp.  The history is provided by the patient.  Abscess Location:  Head/neck Head/neck abscess location:  R neck Size:  Golfball Abscess quality: painful   Abscess quality: no redness and no warmth   Red streaking: no   Duration:  1 hour Progression:  Unchanged Pain details:    Quality:  No pain   Severity:  Moderate   Duration:  1 hour   Timing:  Constant   Progression:  Unchanged Chronicity:  New Relieved by:  Nothing Worsened by:  Nothing Ineffective treatments:  None tried Associated symptoms: no fever, no headaches and no vomiting        Past Medical History:  Diagnosis Date  . Abscess   . Bronchitis   . Diverticulitis   . Knee pain     There are no problems to display for this patient.   Past Surgical History:  Procedure Laterality Date  . ABSCESS DRAINAGE         History reviewed. No pertinent family history.  Social History   Tobacco Use  . Smoking status: Never Smoker  . Smokeless tobacco: Never Used  Vaping Use  . Vaping Use: Never used  Substance Use Topics  . Alcohol use: Not Currently  . Drug use: Not Currently    Types: Marijuana    Home Medications Prior to Admission medications   Medication Sig Start Date End Date Taking? Authorizing Provider  amoxicillin (AMOXIL) 500 MG tablet Take 2 tablets (1,000 mg total) by mouth 2 (two) times daily. 04/01/20    Melene Plan, DO  atorvastatin (LIPITOR) 20 MG tablet Take 1 tablet by mouth daily. 09/09/14   [provider]  ciprofloxacin (CIPRO) 500 MG tablet Take 1 tablet (500 mg total) by mouth 2 (two) times daily. 09/22/14   Arby Barrette, MD  docusate sodium (COLACE) 100 MG capsule Take 1 capsule (100 mg total) by mouth every 12 (twelve) hours. 09/22/14   Arby Barrette, MD  doxazosin (CARDURA) 2 MG tablet Take 1 tablet by mouth daily. 09/09/14   [provider]  HYDROcodone-acetaminophen (NORCO/VICODIN) 5-325 MG per tablet Take 1-2 tablets by mouth every 4 (four) hours as needed for moderate pain or severe pain. 09/22/14   Arby Barrette, MD  metroNIDAZOLE (FLAGYL) 500 MG tablet Take 1 tablet (500 mg total) by mouth 2 (two) times daily. One po bid x 7 days 09/22/14   Arby Barrette, MD  naproxen sodium (ANAPROX) 220 MG tablet Take 220 mg by mouth 2 (two) times daily as needed (pain).    [provider]    Allergies    Echinacea-zn [zinc]  Review of Systems   Review of Systems  Constitutional: Negative for chills and fever.  HENT: Negative for congestion, dental problem, ear pain, facial swelling, nosebleeds, rhinorrhea, sinus pressure, sinus pain,  sore throat, tinnitus and trouble swallowing.   Eyes: Negative for discharge and visual disturbance.  Respiratory: Negative for shortness of breath.   Cardiovascular: Negative for chest pain and palpitations.  Gastrointestinal: Negative for abdominal pain, diarrhea and vomiting.  Musculoskeletal: Negative for arthralgias and myalgias.  Skin: Negative for color change and rash.  Neurological: Negative for tremors, syncope and headaches.  Psychiatric/Behavioral: Negative for confusion and dysphoric mood.    Physical Exam Updated Vital Signs BP (!) 174/91 (BP Location: Left Arm)   Pulse 82   Temp 98 F (36.7 C) (Oral)   Resp 18   Ht 6' (1.829 m)   Wt 107 kg   SpO2 100%   BMI 32.01 kg/m   Physical Exam Vitals and  nursing note reviewed.  Constitutional:      Appearance: He is well-developed and well-nourished.  HENT:     Head: Normocephalic and atraumatic.     Mouth/Throat:     Comments: Swelling at the angle of the jaw on the R.  R sided effusion without bulging.  Mildly erythematous. L TM normal.   Soft tissue swelling to the area of the inferior posterior molar bilaterally.  Nontender.  No sublingual swelling.  Tonsils without edema or purulence.  Uvula is midline.  Tolerating secretions difficulty.  Scalp without any abnormality.  No erythema no fluctuance. Eyes:     Extraocular Movements: EOM normal.     Pupils: Pupils are equal, round, and reactive to light.  Neck:     Vascular: No JVD.  Cardiovascular:     Rate and Rhythm: Normal rate and regular rhythm.     Heart sounds: No murmur heard. No friction rub. No gallop.   Pulmonary:     Effort: No respiratory distress.     Breath sounds: No wheezing.  Abdominal:     General: There is no distension.     Tenderness: There is no abdominal tenderness. There is no guarding or rebound.  Musculoskeletal:        General: Normal range of motion.     Cervical back: Normal range of motion and neck supple.  Skin:    Coloration: Skin is not pale.     Findings: No rash.  Neurological:     Mental Status: He is alert and oriented to person, place, and time.  Psychiatric:        Mood and Affect: Mood and affect normal.        Behavior: Behavior normal.     ED Results / Procedures / Treatments   Labs (all labs ordered are listed, but only abnormal results are displayed) Labs Reviewed - No data to display  EKG None  Radiology No results found.  Procedures Procedures   Medications Ordered in ED Medications  amoxicillin (AMOXIL) capsule 1,000 mg (has no administration in time range)    ED Course  I have reviewed the triage vital signs and the nursing notes.  Pertinent labs & imaging results that were available during my care of the  patient were reviewed by me and considered in my medical decision making (see chart for details).    MDM Rules/Calculators/A&P                          75 yo M with a chief complaints of swelling to the right angle of the jaw.  Most likely this is a swollen lymph node.  Mildly tender on exam.  No obvious skin changes consistent with  cellulitis or abscess.  Right TM with possible infection.  Will start on antibiotics.  PCP follow-up.  4:07 PM:  I have discussed the diagnosis/risks/treatment options with the patient and believe the pt to be eligible for discharge home to follow-up with PCP. We also discussed returning to the ED immediately if new or worsening sx occur. We discussed the sx which are most concerning (e.g., sudden worsening pain, fever, inability to tolerate by mouth) that necessitate immediate return. Medications administered to the patient during their visit and any new prescriptions provided to the patient are listed below.  Medications given during this visit Medications  amoxicillin (AMOXIL) capsule 1,000 mg (has no administration in time range)     The patient appears reasonably screen and/or stabilized for discharge and I doubt any other medical condition or other [facility] requiring further screening, evaluation, or treatment in the ED at this time prior to discharge.   Final Clinical Impression(s) / ED Diagnoses Final diagnoses:  Acute suppurative otitis media of right ear without spontaneous rupture of tympanic membrane, recurrence not specified    Rx / DC Orders ED Discharge Orders         Ordered    amoxicillin (AMOXIL) 500 MG tablet  2 times daily,   Status:  Discontinued        04/01/20 1600    amoxicillin (AMOXIL) 500 MG tablet  2 times daily        04/01/20 1603           Melene Plan, DO 04/01/20 1607

## 2020-04-01 NOTE — Discharge Instructions (Signed)
Follow up with your family doctor.  If his swelling does not resolve with the antibiotics then they may want to send you to a specialist to have it biopsied.  Please return for rapid swelling if you have trouble breathing or swallowing or if you develop a fever.

## 2020-04-01 NOTE — ED Triage Notes (Signed)
Patient states he has an abscess below the right ear. Patient states the area is sore to touch. Raised area, but no redness noted.

## 2020-12-26 DIAGNOSIS — Z23 Encounter for immunization: Secondary | ICD-10-CM | POA: Diagnosis not present

## 2020-12-26 DIAGNOSIS — R300 Dysuria: Secondary | ICD-10-CM | POA: Diagnosis not present

## 2021-02-27 DIAGNOSIS — E7800 Pure hypercholesterolemia, unspecified: Secondary | ICD-10-CM | POA: Diagnosis not present

## 2021-02-27 DIAGNOSIS — N3281 Overactive bladder: Secondary | ICD-10-CM | POA: Diagnosis not present

## 2021-02-27 DIAGNOSIS — Z0001 Encounter for general adult medical examination with abnormal findings: Secondary | ICD-10-CM | POA: Diagnosis not present

## 2021-02-27 DIAGNOSIS — Z79899 Other long term (current) drug therapy: Secondary | ICD-10-CM | POA: Diagnosis not present

## 2021-02-27 DIAGNOSIS — Z1211 Encounter for screening for malignant neoplasm of colon: Secondary | ICD-10-CM | POA: Diagnosis not present

## 2021-02-27 DIAGNOSIS — I10 Essential (primary) hypertension: Secondary | ICD-10-CM | POA: Diagnosis not present

## 2021-02-28 DIAGNOSIS — Z1211 Encounter for screening for malignant neoplasm of colon: Secondary | ICD-10-CM | POA: Diagnosis not present

## 2021-12-18 DIAGNOSIS — L602 Onychogryphosis: Secondary | ICD-10-CM | POA: Diagnosis not present

## 2021-12-18 DIAGNOSIS — Z79899 Other long term (current) drug therapy: Secondary | ICD-10-CM | POA: Diagnosis not present

## 2021-12-18 DIAGNOSIS — B353 Tinea pedis: Secondary | ICD-10-CM | POA: Diagnosis not present

## 2021-12-26 ENCOUNTER — Encounter (HOSPITAL_COMMUNITY): Payer: Self-pay

## 2022-01-08 ENCOUNTER — Ambulatory Visit: Payer: Medicare Other | Admitting: Podiatry

## 2022-01-08 DIAGNOSIS — M79674 Pain in right toe(s): Secondary | ICD-10-CM

## 2022-01-08 DIAGNOSIS — B351 Tinea unguium: Secondary | ICD-10-CM

## 2022-01-08 DIAGNOSIS — M79675 Pain in left toe(s): Secondary | ICD-10-CM

## 2022-01-08 NOTE — Progress Notes (Signed)
   Chief Complaint  Patient presents with   Nail Problem    Possible athletes feet patient states this has been an ongoing issue for about 50 + years. Patient states his feet are not itchy but he does believe the fungus is under his nails.     SUBJECTIVE Patient with a history of diabetes mellitus presents to office today complaining of elongated, thickened nails that cause pain while ambulating in shoes.  Patient is unable to trim their own nails. Patient is here for further evaluation and treatment.  Past Medical History:  Diagnosis Date   Abscess    Bronchitis    Diverticulitis    Knee pain     Allergies  Allergen Reactions   Echinacea-Zn [Zinc] Rash     OBJECTIVE General Patient is awake, alert, and oriented x 3 and in no acute distress. Derm Skin is dry and supple bilateral. Negative open lesions or macerations. Remaining integument unremarkable. Nails are tender, long, thickened and dystrophic with subungual debris, consistent with onychomycosis, 1-5 bilateral. No signs of infection noted. Vasc  DP and PT pedal pulses palpable bilaterally. Temperature gradient within normal limits.  Neuro Epicritic and protective threshold sensation diminished bilaterally.  Musculoskeletal Exam No symptomatic pedal deformities noted bilateral. Muscular strength within normal limits.  ASSESSMENT 1. Diabetes Mellitus w/ peripheral neuropathy 2.  Pain due to onychomycosis of toenails bilateral  PLAN OF CARE 1. Patient evaluated today. 2. Instructed to maintain good pedal hygiene and foot care. Stressed importance of controlling blood sugar.  3. Mechanical debridement of nails 1-5 bilaterally performed using a nail nipper. Filed with dremel without incident.  4.  Continue oral Lamisil as prescribed by PCP  5.  Return to clinic in 3 mos.     Felecia Shelling, DPM [facility]  Dr. Felecia Shelling, DPM    [address] [place],  [place] [address]                Office [phone]  Fax [phone]

## 2022-03-02 DIAGNOSIS — Z79899 Other long term (current) drug therapy: Secondary | ICD-10-CM | POA: Diagnosis not present

## 2022-03-02 DIAGNOSIS — N3281 Overactive bladder: Secondary | ICD-10-CM | POA: Diagnosis not present

## 2022-03-02 DIAGNOSIS — B353 Tinea pedis: Secondary | ICD-10-CM | POA: Diagnosis not present

## 2022-03-02 DIAGNOSIS — Z0001 Encounter for general adult medical examination with abnormal findings: Secondary | ICD-10-CM | POA: Diagnosis not present

## 2022-03-02 DIAGNOSIS — Z1211 Encounter for screening for malignant neoplasm of colon: Secondary | ICD-10-CM | POA: Diagnosis not present

## 2022-03-02 DIAGNOSIS — I10 Essential (primary) hypertension: Secondary | ICD-10-CM | POA: Diagnosis not present

## 2022-03-02 DIAGNOSIS — N1832 Chronic kidney disease, stage 3b: Secondary | ICD-10-CM | POA: Diagnosis not present

## 2022-03-02 DIAGNOSIS — Z23 Encounter for immunization: Secondary | ICD-10-CM | POA: Diagnosis not present

## 2022-03-02 DIAGNOSIS — E7800 Pure hypercholesterolemia, unspecified: Secondary | ICD-10-CM | POA: Diagnosis not present

## 2022-03-08 DIAGNOSIS — Z1211 Encounter for screening for malignant neoplasm of colon: Secondary | ICD-10-CM | POA: Diagnosis not present

## 2022-03-30 DIAGNOSIS — Z79899 Other long term (current) drug therapy: Secondary | ICD-10-CM | POA: Diagnosis not present

## 2023-03-05 DIAGNOSIS — N1832 Chronic kidney disease, stage 3b: Secondary | ICD-10-CM | POA: Diagnosis not present

## 2023-03-05 DIAGNOSIS — Z0001 Encounter for general adult medical examination with abnormal findings: Secondary | ICD-10-CM | POA: Diagnosis not present

## 2023-03-05 DIAGNOSIS — Z1211 Encounter for screening for malignant neoplasm of colon: Secondary | ICD-10-CM | POA: Diagnosis not present

## 2023-03-05 DIAGNOSIS — N3281 Overactive bladder: Secondary | ICD-10-CM | POA: Diagnosis not present

## 2023-03-05 DIAGNOSIS — Z79899 Other long term (current) drug therapy: Secondary | ICD-10-CM | POA: Diagnosis not present

## 2023-03-05 DIAGNOSIS — E7800 Pure hypercholesterolemia, unspecified: Secondary | ICD-10-CM | POA: Diagnosis not present

## 2023-03-05 DIAGNOSIS — I10 Essential (primary) hypertension: Secondary | ICD-10-CM | POA: Diagnosis not present

## 2023-03-07 DIAGNOSIS — Z1211 Encounter for screening for malignant neoplasm of colon: Secondary | ICD-10-CM | POA: Diagnosis not present
